# Patient Record
Sex: Female | Born: 1990 | Race: White | Hispanic: No | Marital: Single
Health system: Southern US, Community
[De-identification: ages and names within clinical notes are randomized; demographics above are authoritative.]

---

## 2012-02-26 ENCOUNTER — Ambulatory Visit: Payer: Self-pay | Admitting: Family Medicine

## 2012-06-03 ENCOUNTER — Inpatient Hospital Stay: Payer: Self-pay | Admitting: Psychiatry

## 2012-06-03 LAB — COMPREHENSIVE METABOLIC PANEL
Bilirubin,Total: 0.5 mg/dL (ref 0.2–1.0)
Chloride: 107 mmol/L (ref 98–107)
Co2: 25 mmol/L (ref 21–32)
Creatinine: 0.78 mg/dL (ref 0.60–1.30)
EGFR (Non-African Amer.): >60
Glucose: 87 mg/dL (ref 65–99)
Osmolality: 273 (ref 275–301)
Potassium: 5 mmol/L (ref 3.5–5.1)
SGPT (ALT): 18 U/L (ref 12–78)
Sodium: 138 mmol/L (ref 136–145)
Total Protein: 7.9 g/dL (ref 6.4–8.6)

## 2012-06-03 LAB — URINALYSIS, COMPLETE
Bilirubin,UR: NEGATIVE
Nitrite: NEGATIVE
Specific Gravity: 1.019 (ref 1.003–1.030)
Squamous Epithelial: 5
WBC UR: 1 /HPF (ref 0–5)

## 2012-06-03 LAB — ETHANOL
Ethanol %: <0.003 % (ref 0.000–0.080)
Ethanol: <3 mg/dL

## 2012-06-03 LAB — CBC
HGB: 14.5 g/dL (ref 12.0–16.0)
MCH: 28.6 pg (ref 26.0–34.0)
MCHC: 32.5 g/dL (ref 32.0–36.0)
Platelet: 287 10*3/uL (ref 150–440)
WBC: 11.6 10*3/uL — ABNORMAL HIGH (ref 3.6–11.0)

## 2012-06-03 LAB — DRUG SCREEN, URINE
Amphetamines, Ur Screen: NEGATIVE (ref ?–1000)
Benzodiazepine, Ur Scrn: NEGATIVE (ref ?–200)
Cannabinoid 50 Ng, Ur ~~LOC~~: POSITIVE (ref ?–50)
MDMA (Ecstasy)Ur Screen: NEGATIVE (ref ?–500)
Opiate, Ur Screen: NEGATIVE (ref ?–300)

## 2012-06-03 LAB — TSH: Thyroid Stimulating Horm: 1.1 u[IU]/mL

## 2013-08-10 IMAGING — CR DG CHEST 2V
1 series · 2 of 2 positions shown · non-contrast
Comparison: none

REASON FOR EXAM: cough and right side rib/abdominal pain
COMMENTS:

[Series 1: w chest pa · 0.14mm/px · 2 of 2 slices shown]
[im 1/2]
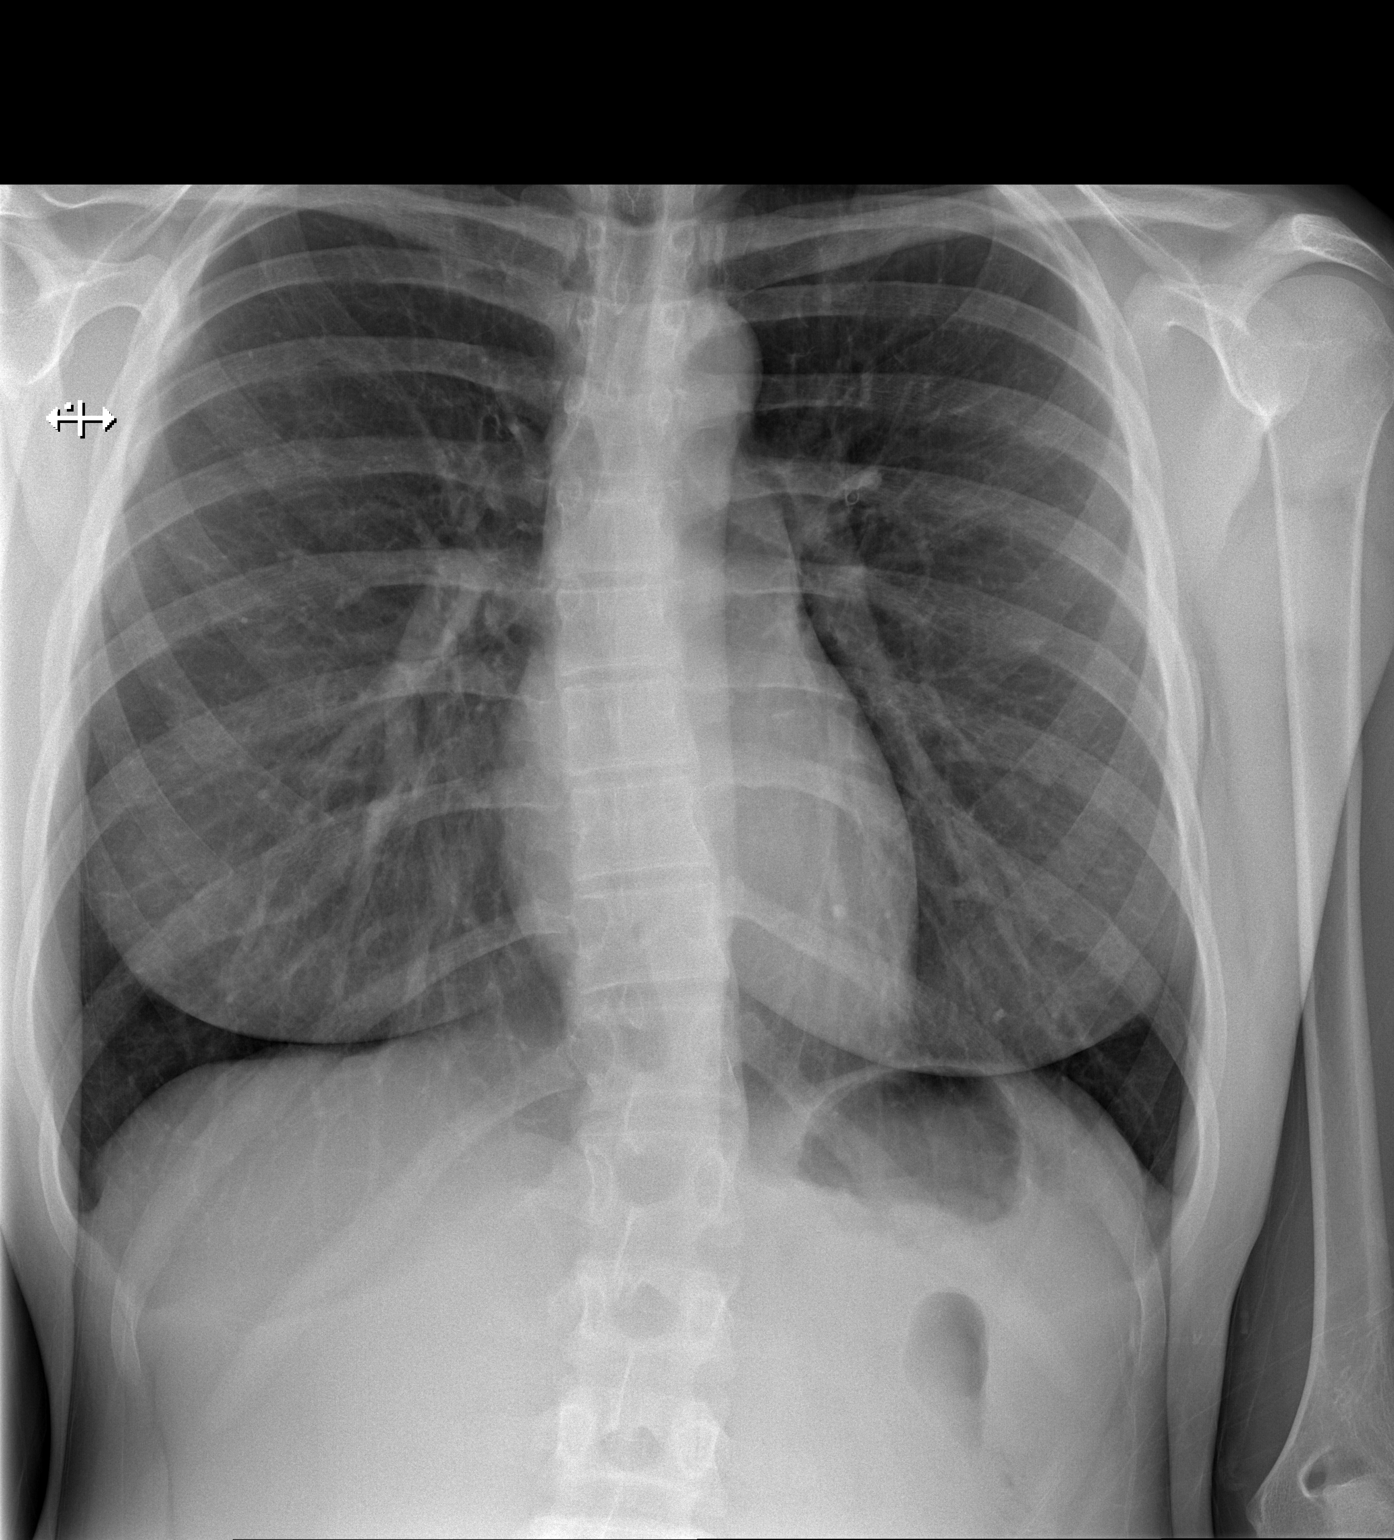
[im 2/2]
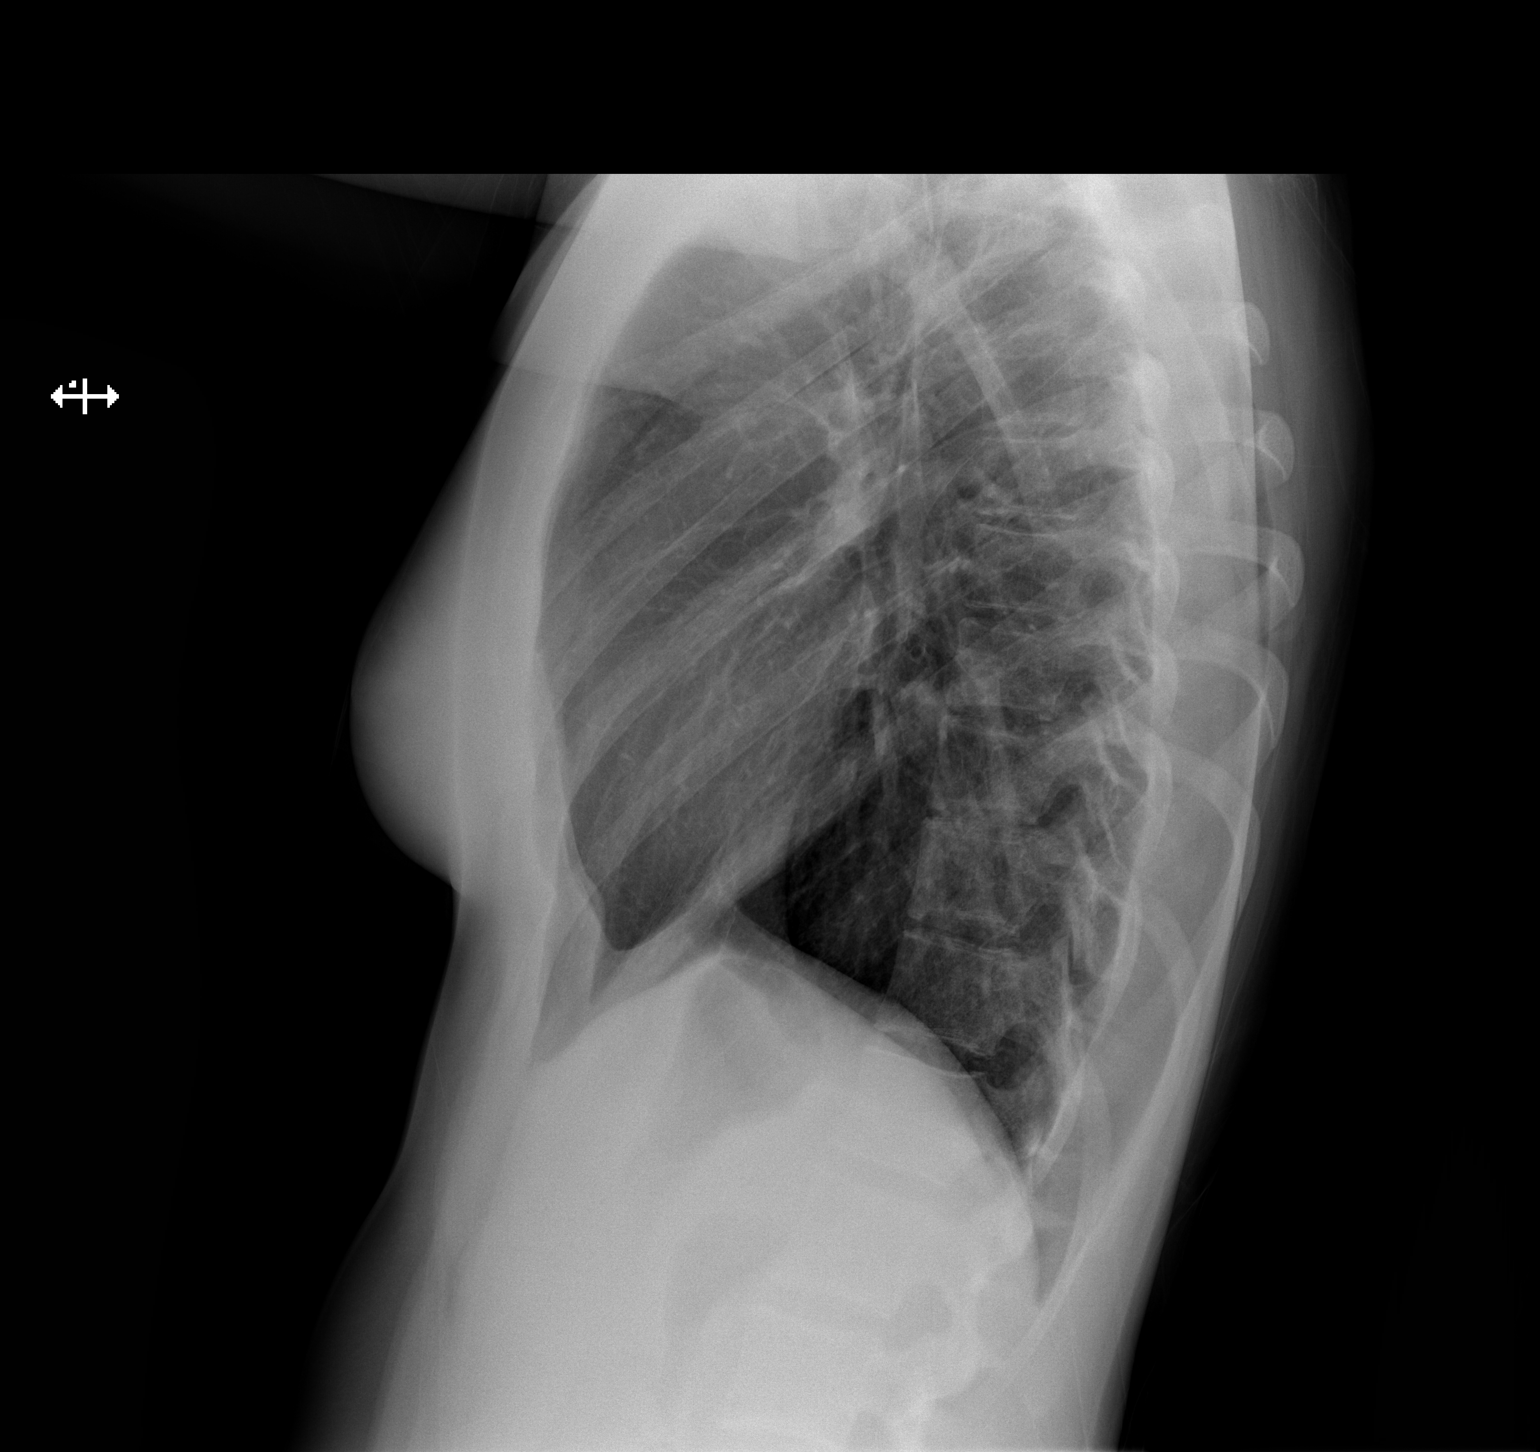

[2 of 2 positions shown; findings below may reference images not displayed]

PROCEDURE:     DXR - DXR CHEST PA (OR AP) AND LATERAL  - February 26, 2012  [DATE]

RESULT:     The lungs are mildly hyperinflated. There is no focal
infiltrate. The cardiac silhouette is normal in size. The pulmonary
vascularity is not engorged. The mediastinum is normal in width. There is no
pleural effusion. There is no evidence of a pneumothorax or
pneumomediastinum. The observed portions of the bony thorax exhibit no acute
abnormalities.
IMPRESSION: There is mild hyperinflation which may reflect reactive
airway disease or acute bronchitis. There is no evidence of pneumonia.

[REDACTED]

## 2014-08-28 NOTE — H&P (Signed)
DATE OF BIRTH:  1991/03/06  REFERRING PHYSICIAN:  Dr. Daryel NovemberJonathan Williams  ATTENDING PHYSICIAN:  Niyanna Asch B. Tacora Athanas, MD  IDENTIFYING DATA:  The patient is a 24 year old female with history of depression and substance abuse.   CHIEF COMPLAINT:  "I'm fine."   HISTORY OF PRESENT ILLNESS:  The patient is an [employer] student. 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 She took the winter semester and is supposed to take her exam tomorrow. Over the weekend, she has been behaving strangely, texting her parents telling them that she was suicidal. She has been screaming on the phone with a friend and also her parents. She on multiple occasions stated that she was suicidal. She is now in the Emergency Room and denies any problems whatsoever. Admits to symptoms of depression that she has been struggling with since the age of 24. She feels that her family is unsupportive. They do not understand the need for treatment. Over the summer, she did see a psychiatrist and was started on low dose of Celexa. She has been increasingly depressed, but denies any problems at school including her academic performance, as well as social life. There is a long history of substance abuse since early teens. The patient admits to drinking 7 or 8 shots a night. She also admits to using acid, but with me, she really denies any substance abuse, stating that it is all in the past. On admission, she was only positive for cannabinoids. She changes her story several times and apparently tells us what she thinks we want to hear. She is very concerned about not being able to participate in her final exam if admitted. She denies psychotic symptoms or symptoms suggestive of bipolar mania.   PAST PSYCHIATRIC HISTORY:  As above, she has been struggling since the age of 24. Only recently, she started talking to her parents about her problems. She has a psychiatrist in [place], did not develop a relationship with mental health provider, a psychiatrist or a  therapist, including [facility] counseling service, since on campus.   FAMILY PSYCHIATRIC HISTORY:  Mother, who takes Ativan for anxiety.   PAST MEDICAL HISTORY:  None.   ALLERGIES:  No known drug allergies.   MEDICATIONS ON ADMISSION:  Celexa 10 mg daily. On another occasion, the patient is telling me that she has been taking Paxil, unknown dose.   SOCIAL HISTORY:  She is from [place]. She has 2 younger brothers and a 22 year old sister. She is the oldest of the siblings. Her family apparently not too supportive. She is a sophomore at [employer], reports no problems with academic performance. She lives alone on campus. She as above is a drinker and uses a variety of different drugs. She does not think that she is better or worse than other people in college. We were able to contact her father, who will start driving to [place] shortly. He expects to be here tomorrow. His plan is to take [employer] back home. She agrees with the plan.   REVIEW OF SYSTEMS:  CONSTITUTIONAL:  No fevers or chills. No weight changes.  EYES:  No double or blurred vision.  ENT:  No hearing loss.  RESPIRATORY:  No shortness of breath or cough.  CARDIOVASCULAR:  No chest pain or orthopnea.  GASTROINTESTINAL:  No abdominal pain, nausea, vomiting or diarrhea.  GENITOURINARY:  No incontinence or frequency.  ENDOCRINE:  No heat or cold intolerance.  LYMPHATIC:  No anemia or easy bruising.  INTEGUMENTARY:  No acne or rash.  MUSCULOSKELETAL:  No muscle or joint pain.  NEUROLOGIC:  No tingling or weakness.  PSYCHIATRIC:  See history of present illness for details.   PHYSICAL EXAMINATION:  VITAL SIGNS:  Blood pressure 118/67, pulse 78, respirations 20, temperature 98.7.  GENERAL:  This is a slender young female, slightly tearful.  HEENT:  The pupils are equal, round and reactive to light. Sclerae anicteric.  NECK:  Supple. No thyromegaly.  LUNGS:  Clear to auscultation. No dullness to percussion.  HEART:  Regular rhythm  and rate. No murmurs, rubs or gallops.  ABDOMEN:  Soft, nontender, nondistended. Positive bowel sounds.  MUSCULOSKELETAL:  Normal muscle strength in all extremities.  SKIN:  No rashes or bruises.  LYMPHATIC:  No cervical adenopathy.  NEUROLOGIC:  Cranial nerves II through XII are intact.   LABORATORY DATA:  Chemistries are within normal limits. LFTs within normal limits. TSH 1.1. Urine tox screen positive for cannabinoids. CBC within normal limits except for white blood count of 11.6. Urinalysis is not suggestive of urinary tract infection. Pregnancy test is negative.   MENTAL STATUS EXAMINATION ON ADMISSION:  The patient is initially examined in the Emergency Room. She is alert and oriented to person, place, time and somewhat to situation, gives me different account than was obtained from the police. She changes her story frequently. She is pleasant, polite and cooperative, rather tearful. She is wearing hospital scrubs and a yellow shirt. She is well groomed. She maintains good eye contact. Her speech is soft, sometimes loud when upset. Mood is depressed with labile affect. Thought processing is logical and goal oriented. Thought content: She denies suicidal or homicidal ideation, but was brought to the hospital by police after threatening suicide on numerous occasions. There are no delusions or paranoia. There are no auditory or visual hallucinations. Her cognition is grossly intact, but impossible to assess today. Her insight and judgment are questionable.   SUICIDE RISK ASSESSMENT ON ADMISSION:  This is a patient with a history of depression and substance abuse, who came to the hospital threatening suicide.   DIAGNOSES:  AXIS I:  Mood disorder, not otherwise specified. Alcohol abuse and marijuana abuse.  AXIS II:  Deferred. AXIS III:  None.  AXIS IV:  Mental illness, substance abuse, primary support, poor coping skills.  AXIS V:  Global Assessment of Functioning on admission 25.   PLAN:  The  patient was admitted to [facility] behavioral medicine unit for safety, stabilization and medication management. She was initially placed on suicide precautions and was closely monitored for any unsafe behaviors. She underwent full psychiatric and risk assessment. She received pharmacotherapy, individual and group psychotherapy, substance abuse counseling, and support from therapeutic milieu.  1.  Suicidal ideation. The patient is able to contract for safety.  2.  Mood. We will continue her home medications for now. We will offer a sleeping aid.  3.  Social. Her father is on the way. He will get in touch with [facility] to find the best way to keep this patient safe and allow her to continue her education.  4.  Disposition. She will be likely discharged to care of her father.      ____________________________ Ellin Goodie. Jennet Maduro, MD jbp:ms D: 06/03/2012 18:28:15 ET T: 06/03/2012 21:32:37 ET JOB#: [id number]  cc: Vanessia Bokhari B. Jennet Maduro, MD, <Dictator> Shari Prows MD ELECTRONICALLY SIGNED 07/04/2012 6:39
